# Patient Record
Sex: Male | Born: 1995 | Race: White | Hispanic: No | Marital: Single | Smoking: Former smoker
Health system: Southern US, Community
[De-identification: ages and names within clinical notes are randomized; demographics above are authoritative.]

## PROBLEM LIST (undated history)

## (undated) HISTORY — PX: HERNIA REPAIR: SHX51

---

## 2014-04-19 ENCOUNTER — Other Ambulatory Visit (HOSPITAL_BASED_OUTPATIENT_CLINIC_OR_DEPARTMENT_OTHER): Payer: Self-pay | Admitting: Internal Medicine

## 2014-04-19 ENCOUNTER — Ambulatory Visit (HOSPITAL_BASED_OUTPATIENT_CLINIC_OR_DEPARTMENT_OTHER)
Admission: RE | Admit: 2014-04-19 | Discharge: 2014-04-19 | Disposition: A | Discharge status: Home / Self Care | Payer: BC Managed Care – PPO | Source: Ambulatory Visit | Attending: Internal Medicine | Admitting: Internal Medicine

## 2014-04-19 DIAGNOSIS — N50819 Testicular pain, unspecified: Secondary | ICD-10-CM

## 2014-04-19 DIAGNOSIS — I861 Scrotal varices: Secondary | ICD-10-CM | POA: Insufficient documentation

## 2016-05-25 ENCOUNTER — Emergency Department (HOSPITAL_COMMUNITY): Payer: Managed Care, Other (non HMO)

## 2016-05-25 ENCOUNTER — Emergency Department (HOSPITAL_COMMUNITY)
Admission: EM | Admit: 2016-05-25 | Discharge: 2016-05-25 | Disposition: A | Discharge status: Home / Self Care | Payer: Managed Care, Other (non HMO) | Attending: Emergency Medicine | Admitting: Emergency Medicine

## 2016-05-25 ENCOUNTER — Encounter (HOSPITAL_COMMUNITY): Payer: Self-pay

## 2016-05-25 DIAGNOSIS — Z87891 Personal history of nicotine dependence: Secondary | ICD-10-CM | POA: Insufficient documentation

## 2016-05-25 DIAGNOSIS — M545 Low back pain, unspecified: Secondary | ICD-10-CM

## 2016-05-25 DIAGNOSIS — R109 Unspecified abdominal pain: Secondary | ICD-10-CM | POA: Insufficient documentation

## 2016-05-25 LAB — URINALYSIS, ROUTINE W REFLEX MICROSCOPIC
Bilirubin Urine: NEGATIVE
Glucose, UA: NEGATIVE mg/dL
Hgb urine dipstick: NEGATIVE
Ketones, ur: NEGATIVE mg/dL
Leukocytes, UA: NEGATIVE
Nitrite: NEGATIVE
Protein, ur: NEGATIVE mg/dL
Specific Gravity, Urine: 1.008 (ref 1.005–1.030)
pH: 6 (ref 5.0–8.0)

## 2016-05-25 MED ORDER — IBUPROFEN 200 MG PO TABS
600.0000 mg | ORAL_TABLET | Freq: Once | ORAL | Status: AC
  Administered 2016-05-25: 600 mg via ORAL
  Filled 2016-05-25: qty 1

## 2016-05-25 MED ORDER — OXYCODONE-ACETAMINOPHEN 5-325 MG PO TABS
2.0000 | ORAL_TABLET | Freq: Once | ORAL | Status: AC
  Administered 2016-05-25: 2 via ORAL
  Filled 2016-05-25: qty 2

## 2016-05-25 MED ORDER — ONDANSETRON 4 MG PO TBDP
4.0000 mg | ORAL_TABLET | Freq: Once | ORAL | Status: AC
  Administered 2016-05-25: 4 mg via ORAL
  Filled 2016-05-25: qty 1

## 2016-05-25 NOTE — ED Notes (Addendum)
Pt c/o left lower back pain starting last night and worsening over time. Denies dysuria but states pain appears to radiate forward to left lower side.

## 2016-06-08 NOTE — ED Provider Notes (Signed)
CSN: [id number]     Arrival date & time 05/25/16  0709 History   First MD Initiated Contact with Patient 05/25/16 0725     Chief Complaint  Patient presents with  . Back Pain     (Consider location/radiation/quality/duration/timing/severity/associated sxs/prior Treatment) HPI   20 year old male with left lower back/left flank pain. Gradual onset. Pain is sharp. Waxes and wanes but doesn't completely go away. No appreciable exacerbating relieving factors. No urinary complaints. No fevers or chills. Denies history of similar symptoms. Denies any trauma, or strain. Has not tried taking anything for his symptoms.  History reviewed. No pertinent past medical history. Past Surgical History  Procedure Laterality Date  . Hernia repair      left iguinal   No family history on file. Social History  Substance Use Topics  . Smoking status: Former Games developermoker  . Smokeless tobacco: None  . Alcohol Use: Yes     Comment: occasional    Review of Systems  All systems reviewed and negative, other than as noted in HPI.   Allergies  Review of patient's allergies indicates no known allergies.  Home Medications   Prior to Admission medications   Medication Sig Start Date End Date Taking? Authorizing Provider  B Complex-C (B-COMPLEX WITH VITAMIN C) tablet Take 1 tablet by mouth every other day.   Yes Historical Provider, MD   BP 143/78 mmHg  Pulse 74  Temp(Src) 97.6 F (36.4 C) (Oral)  Resp 16  Ht 6\' 3"  (1.905 m)  Wt 160 lb (72.576 kg)  BMI 20.00 kg/m2  SpO2 95% Physical Exam  Constitutional: He appears well-developed and well-nourished. No distress.  HENT:  Head: Normocephalic and atraumatic.  Eyes: Conjunctivae are normal. Right eye exhibits no discharge. Left eye exhibits no discharge.  Neck: Neck supple.  Cardiovascular: Normal rate, regular rhythm and normal heart sounds.  Exam reveals no gallop and no friction rub.   No murmur heard. Pulmonary/Chest: Effort normal and breath  sounds normal. No respiratory distress.  Abdominal: Soft. He exhibits no distension.  Musculoskeletal: He exhibits no edema or tenderness.  Neurological: He is alert.  Skin: Skin is warm and dry.  Psychiatric: He has a normal mood and affect. His behavior is normal. Thought content normal.  Nursing note and vitals reviewed.   ED Course  Procedures (including critical care time) Labs Review Labs Reviewed  URINALYSIS, ROUTINE W REFLEX MICROSCOPIC (NOT AT [facility])    Imaging Review No results found. I have personally reviewed and evaluated these images and lab results as part of my medical decision-making.   EKG Interpretation None      MDM   Final diagnoses:  Left-sided low back pain without sciatica    20 year old male with left lower back pain. Likely strain? Initially thought this may be ureteral colic but CT negative and urinalysis normal as well.  No concerning "red flags." Return precautions were discussed. Hypertension was noted. Follows to follow up to have this rechecked.    Raeford RazorStephen Deanta Mincey, MD 06/08/16 2104

## 2017-07-17 IMAGING — CT CT ABD-PELV W/O CM
2 of 4 series · 11 of 46 positions shown, 12 images · non-contrast
Comparison: None.

CLINICAL DATA: Left flank pain with nausea, acute

EXAM:
CT ABDOMEN AND PELVIS WITHOUT CONTRAST
TECHNIQUE: Multidetector CT imaging of the abdomen and pelvis was performed
following the standard protocol without oral or intravenous contrast
material administration.

[Series 201: stone study, idose (2) · axial · 0.68mm/px · z∈[+7,+407]mm · 8 of 98 slices shown, 9 images]
[im 9/98  soft-tissue]
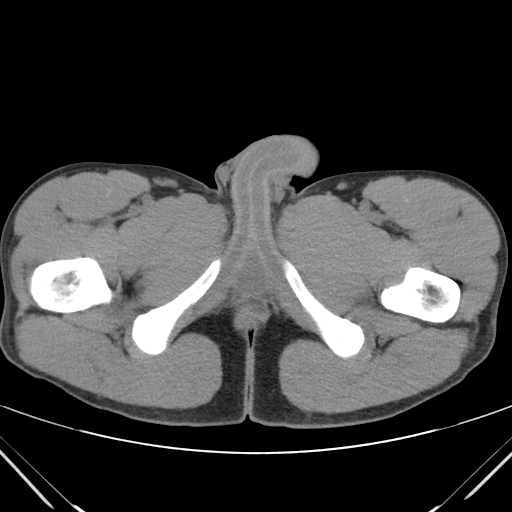
[im 9/98  bone]
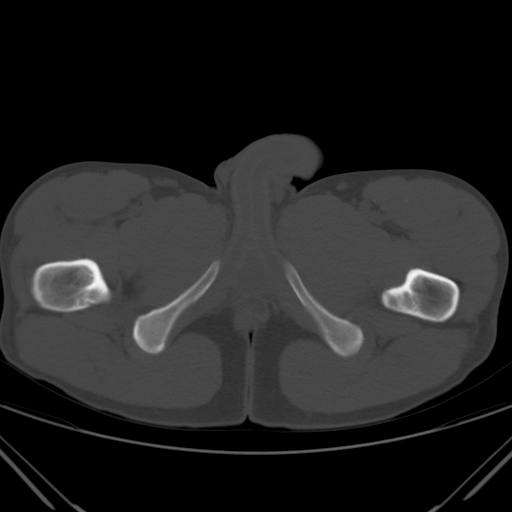
[im 22/98  soft-tissue]
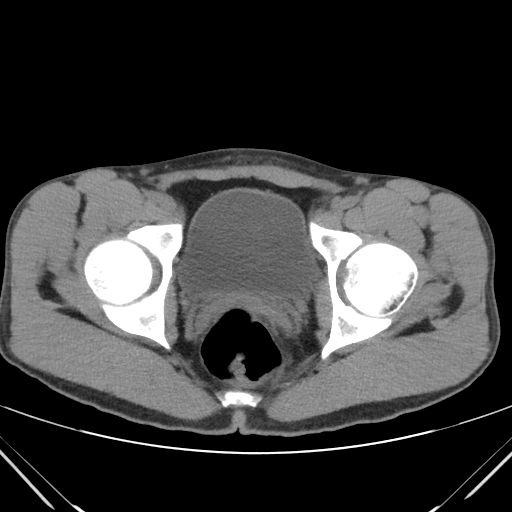
[im 30/98  soft-tissue]
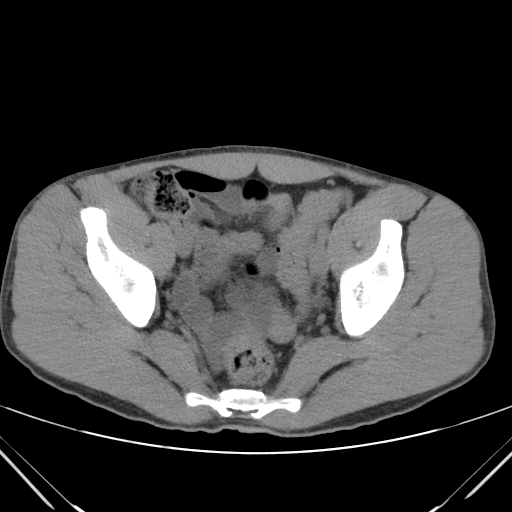
[im 43/98  soft-tissue]
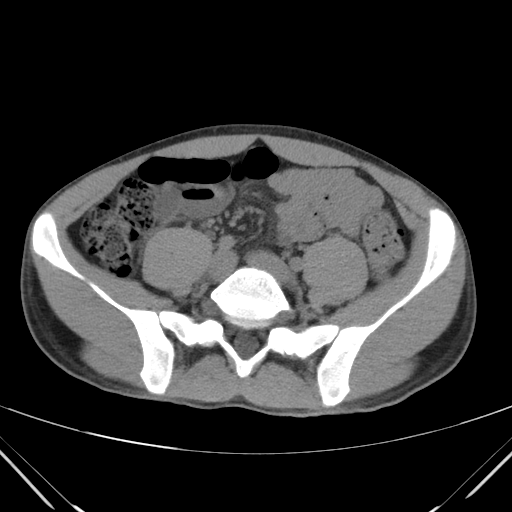
[im 55/98  soft-tissue]
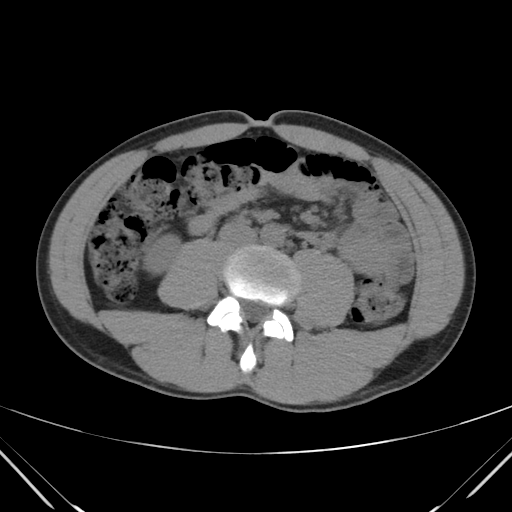
[im 68/98  soft-tissue]
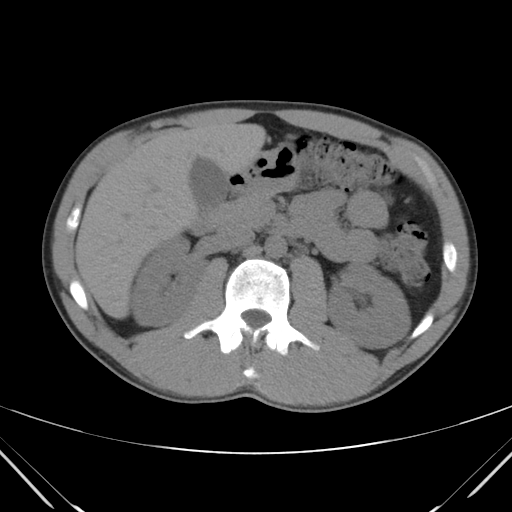
[im 76/98  soft-tissue]
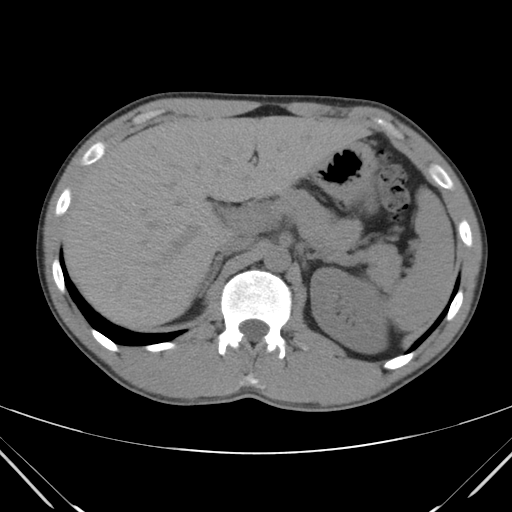
[im 89/98  soft-tissue]
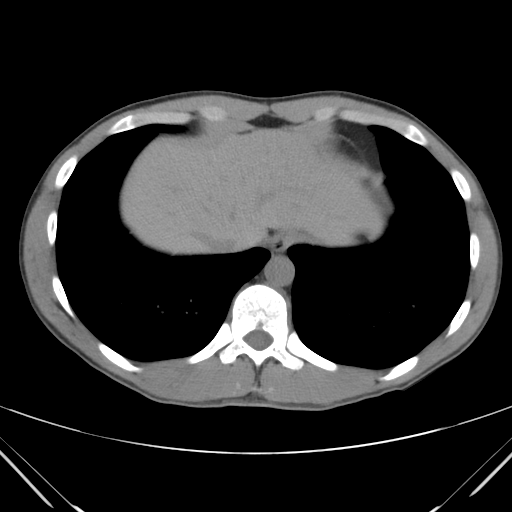

[Series 203: coronals, idose (2) · coronal · 0.45mm/px · 3 of 120 slices shown]
[im 40/120  soft-tissue]
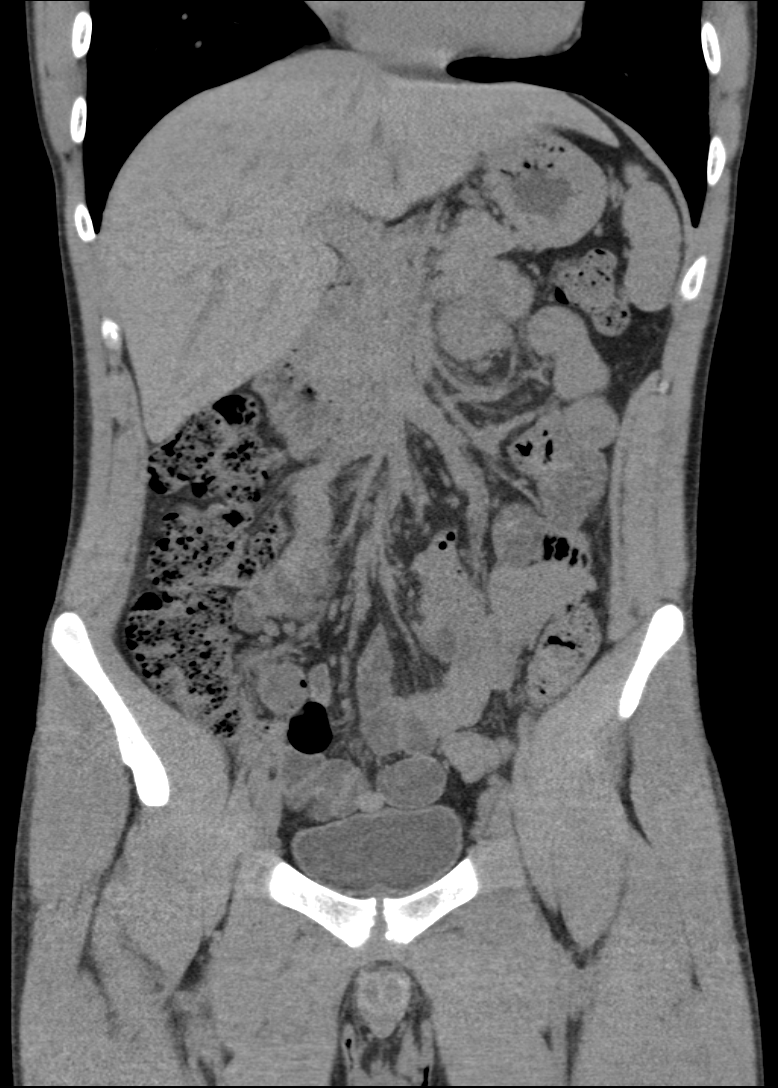
[im 53/120  soft-tissue]
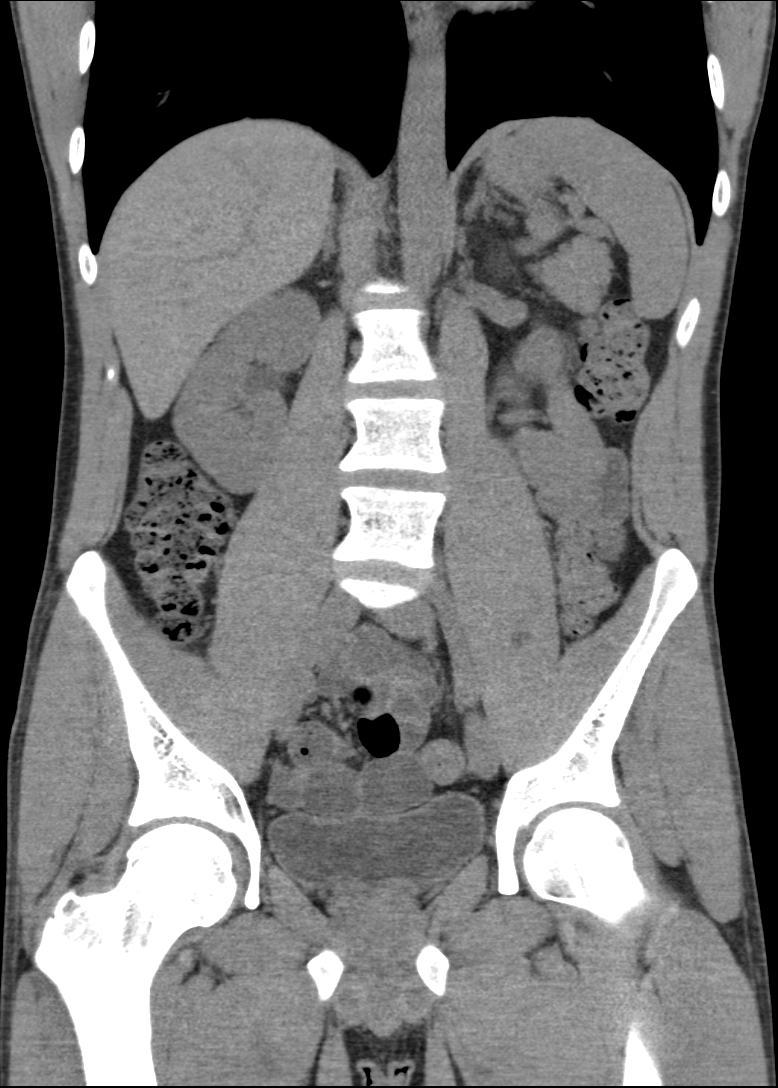
[im 67/120  soft-tissue]
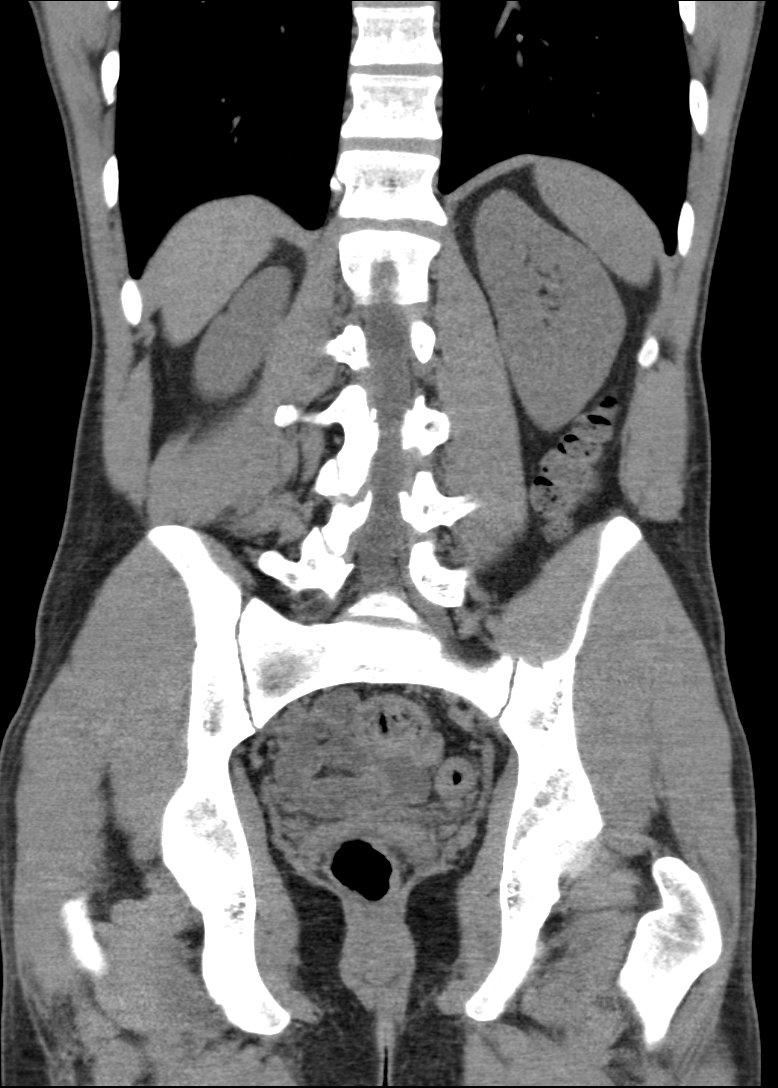

[11 of 46 positions shown; findings below may reference images not displayed]

FINDINGS: Lower chest:  Lung bases are clear.

Hepatobiliary: No focal liver lesion is identified on this
noncontrast enhanced study. Gallbladder wall is not appreciably
thickened. There is no biliary duct dilatation.

Pancreas: No pancreatic mass or inflammatory focus.

Spleen: No splenic lesions are evident.

Adrenals/Urinary Tract: Adrenals appear normal bilaterally. Kidneys
bilaterally show no evidence of mass or hydronephrosis on either
side. There is no renal or ureteral calculus on either side. Urinary
bladder is midline with wall thickness within normal limits.

Stomach/Bowel: There are occasional sigmoid diverticula without
diverticulitis. There is no bowel wall or mesenteric thickening. No
bowel obstruction. No free air or portal venous air. There is
moderately diffuse stool throughout the colon.

Vascular/Lymphatic: There is no abdominal aortic aneurysm. No
vascular lesions are evident on this noncontrast enhanced study.
There is no adenopathy in the abdomen or pelvis.

Reproductive: Prostate and seminal vesicles appear normal. No pelvic
mass or pelvic fluid collection.

Other: Appendix appears unremarkable. There is no abscess or ascites
in the abdomen or pelvis.

Musculoskeletal: There is mild mid lumbar levoscoliosis. There are
no blastic or lytic bone lesions. There is no intramuscular or
abdominal wall lesion.
IMPRESSION: A cause for patient's symptoms has not been established with this
study.

No renal or ureteral calculus.  No hydronephrosis on either side.

There is fairly diffuse stool throughout colon. No bowel obstruction
or bowel wall thickening. No abscess. No periappendiceal region
inflammation.

There are occasional sigmoid diverticula without diverticulitis.

## 2018-02-18 ENCOUNTER — Ambulatory Visit (INDEPENDENT_AMBULATORY_CARE_PROVIDER_SITE_OTHER): Payer: BLUE CROSS/BLUE SHIELD | Admitting: Orthopedic Surgery

## 2018-02-18 ENCOUNTER — Encounter (INDEPENDENT_AMBULATORY_CARE_PROVIDER_SITE_OTHER): Payer: Self-pay | Admitting: Orthopedic Surgery

## 2018-02-18 ENCOUNTER — Ambulatory Visit (INDEPENDENT_AMBULATORY_CARE_PROVIDER_SITE_OTHER): Payer: BLUE CROSS/BLUE SHIELD

## 2018-02-18 VITALS — Ht 75.0 in | Wt 160.0 lb

## 2018-02-18 DIAGNOSIS — M25531 Pain in right wrist: Secondary | ICD-10-CM

## 2018-02-18 NOTE — Progress Notes (Signed)
   Office Visit Note   Patient: Willie Hill           Date of Birth: 02/17/1996           MRN: [id number] Visit Date: 02/18/2018              Requested by: No referring provider defined for this encounter. PCP: Gaspar Garbeisovec, Richard W, MD  Chief Complaint  Patient presents with  . Right Wrist - Pain      HPI: Patient states that while playing tennis he sustained a sprain to his right wrist about 6 weeks ago.  He states he has pain with maximum flexion of the wrist he states he is still symptomatic he complains of decreased range of motion and weakness secondary to pain.  Assessment & Plan: Visit Diagnoses:  1. Pain in right wrist     Plan: Recommended a wrist splint to decreased range of motion of his wrist and recommend Aleve 2 p.o. twice daily.  Discussed that if he is still symptomatic in 6 weeks to call and we would set up an MRI scan to further evaluate his TFCC.  Follow-Up Instructions: Return if symptoms worsen or fail to improve.   Ortho Exam  Patient is alert, oriented, no adenopathy, well-dressed, normal affect, normal respiratory effort. Examination patient has good pulses he has a good grip strength.  He has no tenderness to palpation of the scaphoid or scapholunate interval.  The first dorsal extensor compartment is nontender he has good active range of motion of all fingers of his hand is neurovascular intact.  He is tender to palpation over the TFCC and ulnar grind reproduces TFCC pain.  Imaging: Xr Wrist 2 Views Right  Result Date: 02/18/2018 2 view radiographs of the right wrist shows a negative ulnar variance there is no widening of the scapholunate interval no signs of a scaphoid fracture the joint spaces are congruent.  No images are attached to the encounter.  Labs: No results found for: HGBA1C, ESRSEDRATE, CRP, LABURIC, REPTSTATUS, GRAMSTAIN, CULT, LABORGA  @LABSALLVALUES (HGBA1)@  Body mass index is 20 kg/m.  Orders:  Orders Placed This Encounter    Procedures  . XR Wrist 2 Views Right   No orders of the defined types were placed in this encounter.    Procedures: No procedures performed  Clinical Data: No additional findings.  ROS:  All other systems negative, except as noted in the HPI. Review of Systems  Objective: Vital Signs: Ht 6\' 3"  (1.905 m)   Wt 160 lb (72.6 kg)   BMI 20.00 kg/m   Specialty Comments:  No specialty comments available.  PMFS History: There are no active problems to display for this patient.  History reviewed. No pertinent past medical history.  History reviewed. No pertinent family history.  Past Surgical History:  Procedure Laterality Date  . HERNIA REPAIR     left iguinal   Social History   Occupational History  . Not on file  Tobacco Use  . Smoking status: Former Smoker  Substance and Sexual Activity  . Alcohol use: Yes    Comment: occasional  . Drug use: No  . Sexual activity: No
